# Patient Record
Sex: Female | Born: 2000 | Race: White | Hispanic: No | Smoking: Never smoker
Health system: Southern US, Community
[De-identification: ages and names within clinical notes are randomized; demographics above are authoritative.]

---

## 2018-01-07 ENCOUNTER — Ambulatory Visit (INDEPENDENT_AMBULATORY_CARE_PROVIDER_SITE_OTHER): Payer: Medicaid Other | Admitting: Allergy and Immunology

## 2018-01-07 ENCOUNTER — Encounter: Payer: Self-pay | Admitting: Allergy and Immunology

## 2018-01-07 ENCOUNTER — Encounter

## 2018-01-07 VITALS — BP 130/80 | HR 88 | Temp 99.2°F | Resp 24 | Ht 64.8 in | Wt 117.0 lb

## 2018-01-07 DIAGNOSIS — L501 Idiopathic urticaria: Secondary | ICD-10-CM

## 2018-01-07 DIAGNOSIS — L505 Cholinergic urticaria: Secondary | ICD-10-CM | POA: Diagnosis not present

## 2018-01-07 MED ORDER — RANITIDINE HCL 150 MG PO TABS: 150.0000 mg | ORAL_TABLET | Freq: Two times a day (BID) | ORAL | 5 refills | Status: DC

## 2018-01-07 MED ORDER — LORATADINE 10 MG PO TABS: 10.0000 mg | ORAL_TABLET | Freq: Two times a day (BID) | ORAL | 5 refills | Status: DC

## 2018-01-07 MED ORDER — MONTELUKAST SODIUM 10 MG PO TABS: 10.0000 mg | ORAL_TABLET | Freq: Every day | ORAL | 5 refills | Status: DC

## 2018-01-07 NOTE — Patient Instructions (Addendum)
  1.  Allergen avoidance measures?  2.  Every day utilize the following medications:   A.  Loratadine 10 mg twice a day  B.  Ranitidine 150 mg twice a day  C.  Montelukast 10 mg once a day  3.  Blood - CBC w/diff, CMP, TSH, T4, TP, alpha gal + UA  4.  Omalizumab injections?  5.  Return to clinic in 4 weeks or earlier if problem

## 2018-01-07 NOTE — Progress Notes (Signed)
Dear Dr. Jeanie Sewer,  Thank you for referring Heather Collier to the [facility] [place] on 01/07/2018.   Below is a summation of this patient's evaluation and recommendations.  Thank you for your referral. I will keep you informed about this patient's response to treatment.   If you have any questions please do not hesitate to contact me.   Sincerely,  Jessica Priest, MD Allergy / Immunology [place] [facility]   ______________________________________________________________________    NEW PATIENT NOTE  Referring Provider: Noni Saupe, MD Primary Provider: Noni Saupe, MD Date of office visit: 01/07/2018    Subjective:   Chief Complaint:  Heather Collier (DOB: 01/02/2001) is a 17 y.o. female who presents to the clinic on 01/07/2018 with a chief complaint of Urticaria .     HPI: [name] presents to this clinic in evaluation of urticaria.  Apparently while she was pregnant starting in February 2018 she developed red raised itchy lesions that developed across her body without any healing with scar or hyperpigmentation and without any associated systemic or constitutional symptoms and with a very significant heat to trigger.  If she gets hot or exercises or gets sweaty or takes a hot shower she developed diffuse urticaria.  Even without heat though she does develop problems with urticaria.  Her lesions never last greater than a day or so.  She has tried antihistamines but they have made her so sleepy she cannot function.  It sounds as though she has tried Zyrtec and Benadryl.  She is tried topical Benadryl which does not work.  There is no obvious provoking factor giving rise to this issue.  Her pregnancy went well and she delivered in June 2018.  She has not been using any supplements or new medications and she has not had any significant environmental changes that have occurred during this  timeframe.  And she has not had a significant environmental change that may account for this issue.  She does not really have an atopic history except for occasionally pollen causing some problems with watery eyes.  History reviewed. No pertinent past medical history.  History reviewed. No pertinent surgical history.  Allergies as of 01/07/2018   No Known Allergies     Medication List      ibuprofen 800 MG tablet Commonly known as:  ADVIL,MOTRIN TAKE ONE TABLET BY MOUTH EVERY 6 HOURS AS NEEDED FOR PAIN   lamoTRIgine 25 MG tablet Commonly known as:  LAMICTAL TAKE ONE TABLET BY MOUTH EVERY DAY for 14 days then TAKE 2 TABLETS BY MOUTH EVERY DAY   norelgestromin-ethinyl estradiol 150-35 MCG/24HR transdermal patch Commonly known as:  ORTHO EVRA Place onto the skin.       Review of systems negative except as noted in HPI / PMHx or noted below:  Review of Systems  Constitutional: Negative.   HENT: Negative.   Eyes: Negative.   Respiratory: Negative.   Cardiovascular: Negative.   Gastrointestinal: Negative.   Genitourinary: Negative.   Musculoskeletal: Negative.   Skin: Negative.   Neurological: Negative.   Endo/Heme/Allergies: Negative.   Psychiatric/Behavioral: Negative.     History reviewed. No pertinent family history.  Social History   Socioeconomic History  . Marital status: Single    Spouse name: Not on file  . Number of children: Not on file  . Years of education: Not on file  . Highest education level: Not on file  Occupational  History  . Not on file  Social Needs  . Financial resource strain: Not on file  . Food insecurity:    Worry: Not on file    Inability: Not on file  . Transportation needs:    Medical: Not on file    Non-medical: Not on file  Tobacco Use  . Smoking status: Never Smoker  . Smokeless tobacco: Never Used  Substance and Sexual Activity  . Alcohol use: Never    Frequency: Never  . Drug use: Never  . Sexual activity: Not on  file  Lifestyle  . Physical activity:    Days per week: Not on file    Minutes per session: Not on file  . Stress: Not on file  Relationships  . Social connections:    Talks on phone: Not on file    Gets together: Not on file    Attends religious service: Not on file    Active member of club or organization: Not on file    Attends meetings of clubs or organizations: Not on file    Relationship status: Not on file  . Intimate partner violence:    Fear of current or ex partner: Not on file    Emotionally abused: Not on file    Physically abused: Not on file    Forced sexual activity: Not on file  Other Topics Concern  . Not on file  Social History Narrative  . Not on file    Environmental and Social history  Lives in a mobile home with a dry environment, no animals located inside the household, no carpet in the bedroom, no plastic on the bed, no plastic on the pillow, and no smokers located inside the household. Objective:   Vitals:   01/07/18 1339  BP: 130/80  Pulse: 88  Resp: (!) 24  Temp: 99.2 F (37.3 C)  SpO2: 99%   Height: 5' 4.8" (164.6 cm) Weight: 117 lb (53.1 kg)  Physical Exam  HENT:  Head: Normocephalic. Head is without right periorbital erythema and without left periorbital erythema.  Right Ear: Tympanic membrane, external ear and ear canal normal.  Left Ear: Tympanic membrane, external ear and ear canal normal.  Nose: Nose normal. No mucosal edema or rhinorrhea.  Mouth/Throat: Oropharynx is clear and moist and mucous membranes are normal. No oropharyngeal exudate.  Eyes: Pupils are equal, round, and reactive to light. Conjunctivae and lids are normal.  Neck: Trachea normal. No tracheal deviation present. No thyromegaly present.  Cardiovascular: Normal rate, regular rhythm, S1 normal, S2 normal and normal heart sounds.  No murmur heard. Pulmonary/Chest: Effort normal. No stridor. No respiratory distress. She has no wheezes. She has no rales. She exhibits  no tenderness.  Abdominal: Soft. She exhibits no distension and no mass. There is no hepatosplenomegaly. There is no tenderness. There is no rebound and no guarding.  Musculoskeletal: She exhibits no edema or tenderness.  Lymphadenopathy:       Head (right side): No tonsillar adenopathy present.       Head (left side): No tonsillar adenopathy present.    She has no cervical adenopathy.    She has no axillary adenopathy.  Neurological: She is alert.  Skin: Rash (Multiple blanching urticarial lesions extremities and trunk) noted. She is not diaphoretic. No erythema. No pallor. Nails show no clubbing.    Diagnostics: Allergy skin tests were performed.  She did not demonstrate any hypersensitivity to a screening panel of aeroallergens or foods.  Assessment and Plan:  1. Idiopathic urticaria   2. Cholinergic urticaria     1.  Allergen avoidance measures?  2.  Every day utilize the following medications:   A.  Loratadine 10 mg twice a day  B.  Ranitidine 150 mg twice a day  C.  Montelukast 10 mg once a day  3.  Blood - CBC w/diff, CMP, TSH, T4, TP, alpha gal + UA  4.  Omalizumab injections?  5.  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  Some of her presentation is very reminiscent of cholinergic urticaria but certainly this does not appear to explain all of her issues.  We will try her on a collection of medications as noted above to see if we can manage her immunological hyperreactivity and we will also look for a worrisome systemic disease contributing to this issue.  She would be a candidate for omalizumab injections pending her response to the therapy noted above.  I will see her back in this clinic in 4 weeks or earlier if there is a problem.  Jessica Priest, MD Allergy / Immunology [place] [facility] [place]

## 2018-01-11 ENCOUNTER — Encounter: Payer: Self-pay | Admitting: Allergy and Immunology

## 2018-09-03 ENCOUNTER — Other Ambulatory Visit: Payer: Self-pay | Admitting: Allergy and Immunology

## 2021-03-14 ENCOUNTER — Encounter: Payer: Self-pay | Admitting: General Practice

## 2021-03-14 ENCOUNTER — Ambulatory Visit (INDEPENDENT_AMBULATORY_CARE_PROVIDER_SITE_OTHER): Payer: Commercial Managed Care - PPO | Admitting: Family Medicine

## 2021-03-14 ENCOUNTER — Other Ambulatory Visit: Payer: Self-pay

## 2021-03-14 ENCOUNTER — Other Ambulatory Visit (HOSPITAL_COMMUNITY)
Admission: RE | Admit: 2021-03-14 | Discharge: 2021-03-14 | Disposition: A | Discharge status: Home / Self Care | Payer: Commercial Managed Care - PPO | Source: Ambulatory Visit | Attending: Family Medicine | Admitting: Family Medicine

## 2021-03-14 ENCOUNTER — Encounter: Payer: Self-pay | Admitting: Family Medicine

## 2021-03-14 DIAGNOSIS — Z3481 Encounter for supervision of other normal pregnancy, first trimester: Secondary | ICD-10-CM

## 2021-03-14 DIAGNOSIS — Z3A12 12 weeks gestation of pregnancy: Secondary | ICD-10-CM

## 2021-03-14 DIAGNOSIS — Z3480 Encounter for supervision of other normal pregnancy, unspecified trimester: Secondary | ICD-10-CM | POA: Insufficient documentation

## 2021-03-14 MED ORDER — DOXYLAMINE-PYRIDOXINE 10-10 MG PO TBEC: 2.0000 | DELAYED_RELEASE_TABLET | Freq: Every day | ORAL | 5 refills | Status: DC

## 2021-03-14 NOTE — Progress Notes (Signed)
Subjective:  Heather Collier is a G2P1001 [redacted]w[redacted]d by LMP c/w [place] today being seen today for her first obstetrical visit.  Her obstetrical history is significant for  previous uncomplicated pregnancy and vaginal delivery at term . Planned pregnancy - FOB involved. Patient  uncertain about  breast feeding. Pregnancy history fully reviewed.  Patient reports nausea.  BP 129/72   Pulse 85   Wt 111 lb (50.3 kg)   LMP 12/14/2020   BMI 18.59 kg/m   HISTORY: OB History  Gravida Para Term Preterm AB Living  2 1 1     1   SAB IAB Ectopic Multiple Live Births          1    # Outcome Date GA Lbr Len/2nd Weight Sex Delivery Anes PTL Lv  2 Current           1 Term 2018 [redacted]w[redacted]d   F Vag-Spont None N LIV    No past medical history on file.  No past surgical history on file.  Family History  Problem Relation Age of Onset   Diabetes Mother      Exam  BP 129/72   Pulse 85   Wt 111 lb (50.3 kg)   LMP 12/14/2020   BMI 18.59 kg/m   Chaperone present during exam  CONSTITUTIONAL: Well-developed, well-nourished female in no acute distress.  HENT:  Normocephalic, atraumatic, External right and left ear normal. Oropharynx is clear and moist EYES: Conjunctivae and EOM are normal. Pupils are equal, round, and reactive to light. No scleral icterus.  NECK: Normal range of motion, supple, no masses.  Normal thyroid.  CARDIOVASCULAR: Normal heart rate noted, regular rhythm RESPIRATORY: Clear to auscultation bilaterally. Effort and breath sounds normal, no problems with respiration noted. BREASTS: Symmetric in size. No masses, skin changes, nipple drainage, or lymphadenopathy. ABDOMEN: Soft, normal bowel sounds, no distention noted.  No tenderness, rebound or guarding.  PELVIC: Normal appearing external genitalia; normal appearing vaginal mucosa and cervix. No abnormal discharge noted. Normal uterine size, no other palpable masses, no uterine or adnexal tenderness. MUSCULOSKELETAL: Normal range of  motion. No tenderness.  No cyanosis, clubbing, or edema.  2+ distal pulses. SKIN: Skin is warm and dry. No rash noted. Not diaphoretic. No erythema. No pallor. NEUROLOGIC: Alert and oriented to person, place, and time. Normal reflexes, muscle tone coordination. No cranial nerve deficit noted. PSYCHIATRIC: Normal mood and affect. Normal behavior. Normal judgment and thought content.    Assessment:    Pregnancy: G2P1001 Patient Active Problem List   Diagnosis Date Noted   Supervision of other normal pregnancy, antepartum 03/14/2021      Plan:   1. Supervision of other normal pregnancy, antepartum 03/16/2021 done by me shows GA [redacted]w[redacted]d. - CBC/D/Plt+RPR+Rh+ABO+RubIgG... - POC Urinalysis Dipstick OB - Culture, OB Urine - [redacted]w[redacted]d MFM OB COMP + 14 WK; Future - Genetic Screening - Cytology - PAP( [place]) - Hemoglobin A1c - CHL AMB BABYSCRIPTS OPT IN - Cervicovaginal ancillary only( [place])    Initial labs obtained Continue prenatal vitamins Reviewed n/v relief measures and warning s/s to report Reviewed recommended weight gain based on pre-gravid BMI Encouraged well-balanced diet Genetic & carrier screening discussed: requests Panorama,  Ultrasound discussed; fetal survey: requested CCNC completed> form faxed if has or is planning to apply for medicaid The nature of [place] [facility] for [place] with multiple MDs and other Advanced Practice Providers was explained to patient; also emphasized that fellows, residents, and students are part of our team. Home  bp cuff. Check bp weekly, let us know if >140/90.   Problem list reviewed and updated. 75% of 30 min visit spent on counseling and coordination of care.     Levie Heritage 03/14/2021

## 2021-03-15 ENCOUNTER — Other Ambulatory Visit: Payer: Commercial Managed Care - PPO

## 2021-03-15 DIAGNOSIS — Z833 Family history of diabetes mellitus: Secondary | ICD-10-CM

## 2021-03-15 DIAGNOSIS — Z3A13 13 weeks gestation of pregnancy: Secondary | ICD-10-CM

## 2021-03-15 DIAGNOSIS — Z1379 Encounter for other screening for genetic and chromosomal anomalies: Secondary | ICD-10-CM

## 2021-03-15 NOTE — Progress Notes (Signed)
Chart reviewed - agree with CMA/RN documentation.  ° °

## 2021-03-15 NOTE — Progress Notes (Signed)
Pt presents for New OB and Panorama labs. Pt was given lab slip and sent to the lab. Yoceline Bazar l Alayshia Marini, CMA

## 2021-03-15 NOTE — Addendum Note (Signed)
Addended by: Mikey Bussing on: 03/15/2021 08:44 AM   Modules accepted: Orders

## 2021-03-16 LAB — CBC/D/PLT+RPR+RH+ABO+RUBIGG...
Antibody Screen: NEGATIVE
Basophils Absolute: 0 10*3/uL (ref 0.0–0.2)
Basos: 0 %
EOS (ABSOLUTE): 0.1 10*3/uL (ref 0.0–0.4)
Eos: 1 %
HCV Ab: <0.1 s/co ratio (ref 0.0–0.9)
HIV Screen 4th Generation wRfx: NONREACTIVE
Hematocrit: 34.2 % (ref 34.0–46.6)
Hemoglobin: 11.1 g/dL (ref 11.1–15.9)
Hepatitis B Surface Ag: NEGATIVE
Immature Grans (Abs): 0 10*3/uL (ref 0.0–0.1)
Immature Granulocytes: 0 %
Lymphocytes Absolute: 1.4 10*3/uL (ref 0.7–3.1)
Lymphs: 23 %
MCH: 29.4 pg (ref 26.6–33.0)
MCHC: 32.5 g/dL (ref 31.5–35.7)
MCV: 91 fL (ref 79–97)
Monocytes Absolute: 0.3 10*3/uL (ref 0.1–0.9)
Monocytes: 5 %
Neutrophils Absolute: 4.4 10*3/uL (ref 1.4–7.0)
Neutrophils: 71 %
Platelets: 174 10*3/uL (ref 150–450)
RBC: 3.78 x10E6/uL (ref 3.77–5.28)
RDW: 11.8 % (ref 11.7–15.4)
RPR Ser Ql: NONREACTIVE
Rh Factor: POSITIVE
Rubella Antibodies, IGG: 6.95 index (ref >0.99)
WBC: 6.3 10*3/uL (ref 3.4–10.8)

## 2021-03-16 LAB — HEMOGLOBIN A1C
Est. average glucose Bld gHb Est-mCnc: 103 mg/dL
Hgb A1c MFr Bld: 5.2 % (ref 4.8–5.6)

## 2021-03-16 LAB — CULTURE, OB URINE

## 2021-03-16 LAB — URINE CULTURE, OB REFLEX

## 2021-03-16 LAB — HCV INTERPRETATION

## 2021-03-19 LAB — CERVICOVAGINAL ANCILLARY ONLY
Chlamydia: NEGATIVE
Comment: NEGATIVE
Comment: NEGATIVE
Comment: NORMAL
Neisseria Gonorrhea: NEGATIVE
Trichomonas: NEGATIVE

## 2021-03-21 LAB — CYTOLOGY - PAP: Diagnosis: NEGATIVE

## 2021-04-01 ENCOUNTER — Encounter: Payer: Self-pay | Admitting: General Practice

## 2021-04-11 ENCOUNTER — Other Ambulatory Visit: Payer: Self-pay

## 2021-04-11 ENCOUNTER — Ambulatory Visit (INDEPENDENT_AMBULATORY_CARE_PROVIDER_SITE_OTHER): Payer: Commercial Managed Care - PPO | Admitting: Obstetrics and Gynecology

## 2021-04-11 ENCOUNTER — Encounter: Payer: Self-pay | Admitting: Obstetrics and Gynecology

## 2021-04-11 VITALS — BP 103/74 | HR 104 | Wt 113.0 lb

## 2021-04-11 DIAGNOSIS — Z3A16 16 weeks gestation of pregnancy: Secondary | ICD-10-CM | POA: Diagnosis not present

## 2021-04-11 DIAGNOSIS — Z3482 Encounter for supervision of other normal pregnancy, second trimester: Secondary | ICD-10-CM | POA: Diagnosis not present

## 2021-04-11 DIAGNOSIS — Z3480 Encounter for supervision of other normal pregnancy, unspecified trimester: Secondary | ICD-10-CM

## 2021-04-11 NOTE — Progress Notes (Signed)
   PRENATAL VISIT NOTE  Subjective:  Heather Collier is a 20 y.o. G2P1001 at [redacted]w[redacted]d being seen today for ongoing prenatal care.  She is currently monitored for the following issues for this low-risk pregnancy and has Supervision of other normal pregnancy, antepartum on their problem list.  Patient reports no complaints.  Contractions: Not present. Vag. Bleeding: None.  Movement: Absent. Denies leaking of fluid.   The following portions of the patient's history were reviewed and updated as appropriate: allergies, current medications, past family history, past medical history, past social history, past surgical history and problem list.   Objective:   Vitals:   04/11/21 0947  BP: 103/74  Pulse: (!) 104  Weight: 113 lb (51.3 kg)    Fetal Status: Fetal Heart Rate (bpm): 146   Movement: Absent     General:  Alert, oriented and cooperative. Patient is in no acute distress.  Skin: Skin is warm and dry. No rash noted.   Cardiovascular: Normal heart rate noted  Respiratory: Normal respiratory effort, no problems with respiration noted  Abdomen: Soft, gravid, appropriate for gestational age.  Pain/Pressure: Absent     Pelvic: Cervical exam deferred        Extremities: Normal range of motion.  Edema: None  Mental Status: Normal mood and affect. Normal behavior. Normal judgment and thought content.   Assessment and Plan:  Pregnancy: G2P1001 at [redacted]w[redacted]d 1. [redacted] weeks gestation of pregnancy   2. Supervision of other normal pregnancy, antepartum Patient is doing well Patient opted to defer AFP to next visit Anatomy ultrasound scheduled  Preterm labor symptoms and general obstetric precautions including but not limited to vaginal bleeding, contractions, leaking of fluid and fetal movement were reviewed in detail with the patient. Please refer to After Visit Summary for other counseling recommendations.   Return in about 4 weeks (around 05/09/2021) for in person, ROB, Low risk.  Future  Appointments  Date Time Provider Department Center  04/26/2021 10:45 AM [facility] US5 [facility]  05/09/2021  9:15 AM Levie Heritage, DO [facility] None    Catalina Antigua, MD

## 2021-04-26 ENCOUNTER — Other Ambulatory Visit: Payer: Self-pay

## 2021-04-26 ENCOUNTER — Ambulatory Visit: Payer: Medicaid Other | Attending: Family Medicine

## 2021-04-26 DIAGNOSIS — Z3480 Encounter for supervision of other normal pregnancy, unspecified trimester: Secondary | ICD-10-CM | POA: Diagnosis not present

## 2021-04-26 IMAGING — US US MFM OB COMP +14 WKS
1 series · 12 of 28 positions shown · non-contrast
Comparison: none

[Series 1: us mfm ob comp +14 wks · 102 acquisitions, 12 frames shown]
[im 4/102]
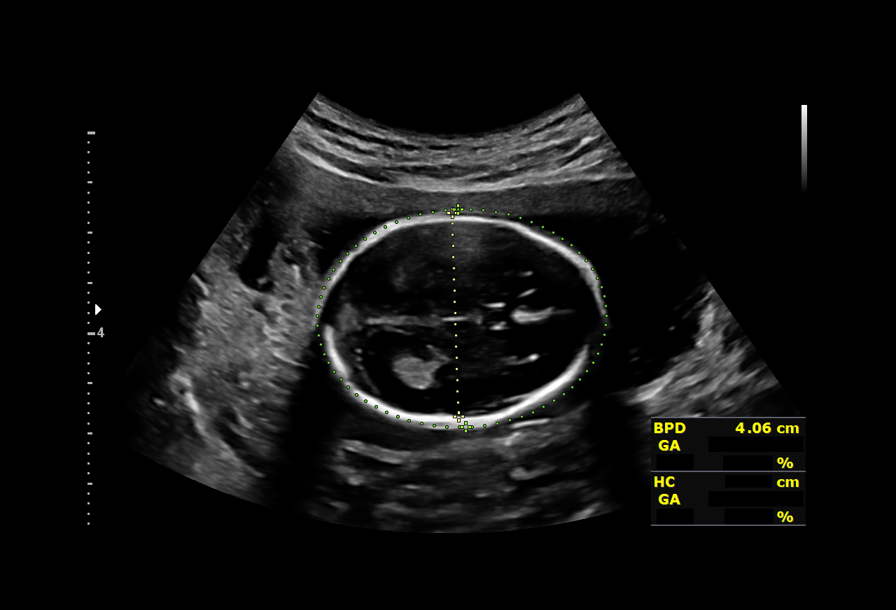
[im 12/102]
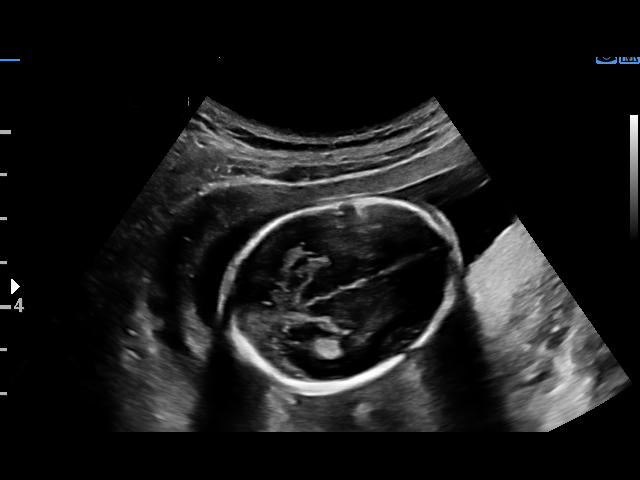
[im 19/102]
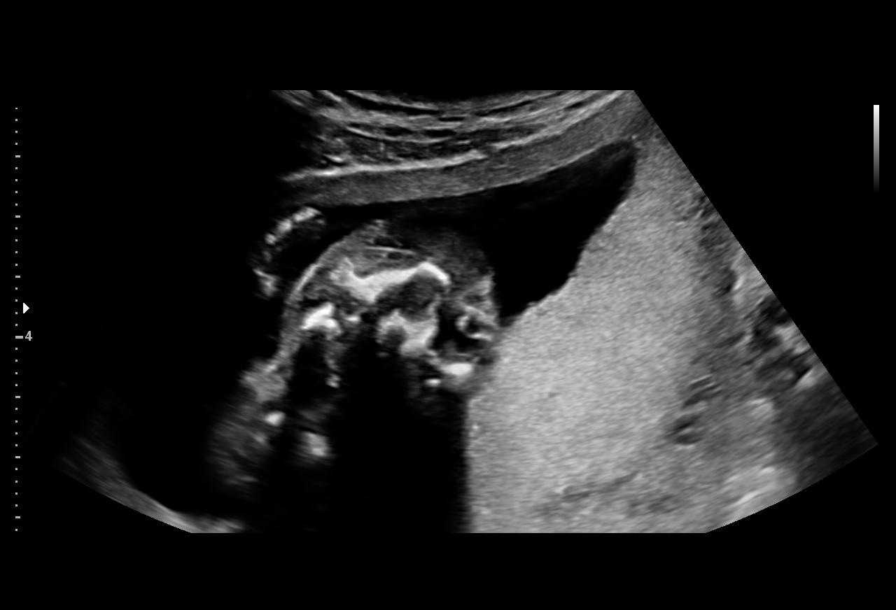
[im 30/102]
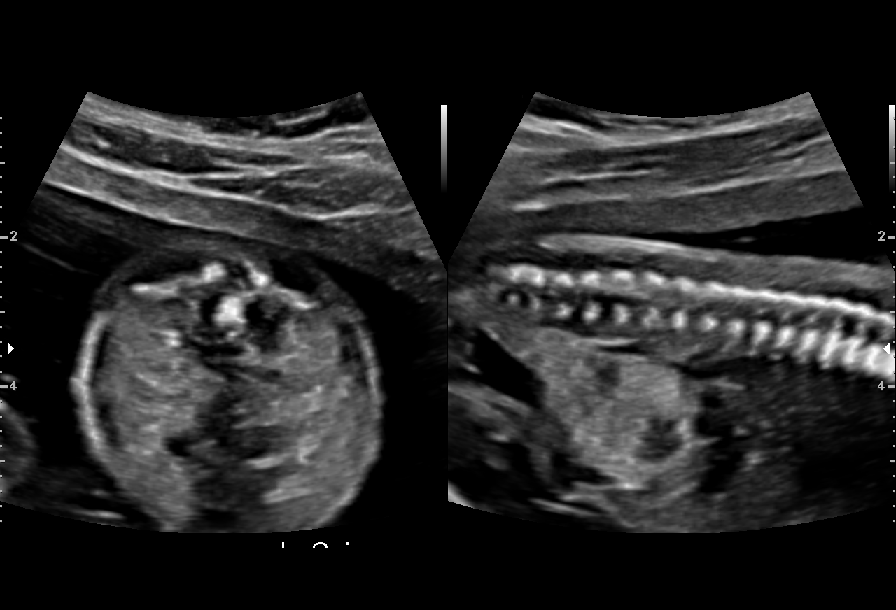
[im 38/102]
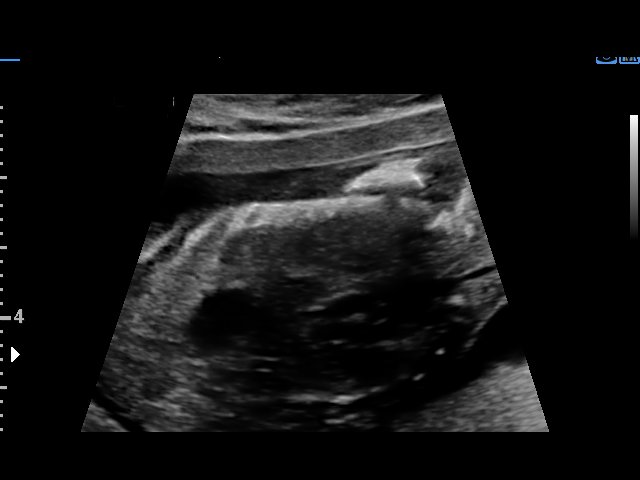
[im 45/102]
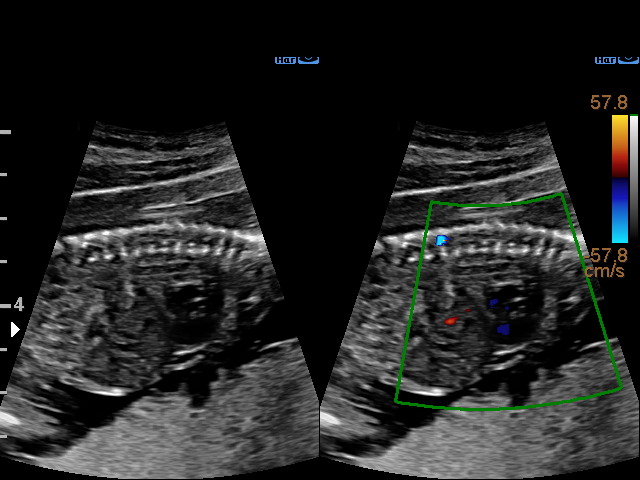
[im 57/102]
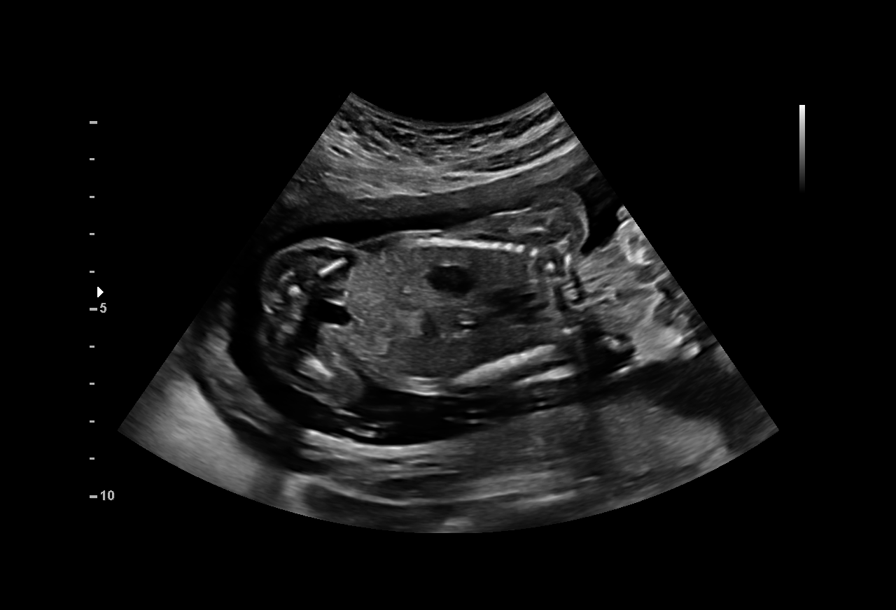
[im 64/102]
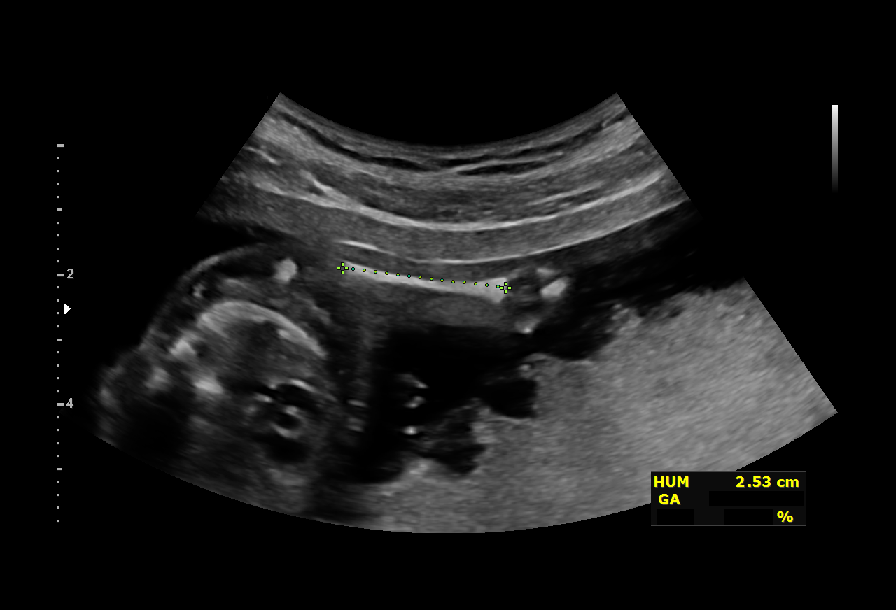
[im 72/102]
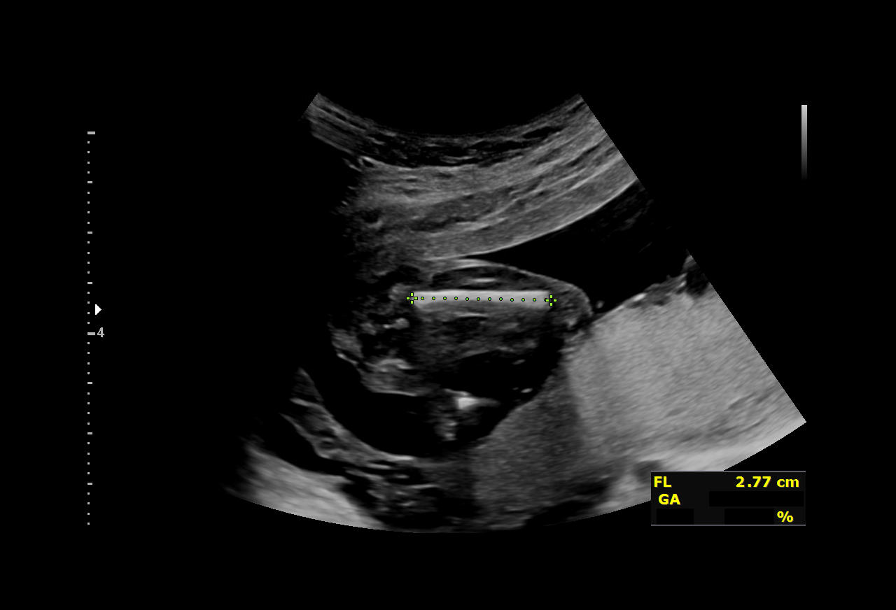
[im 83/102]
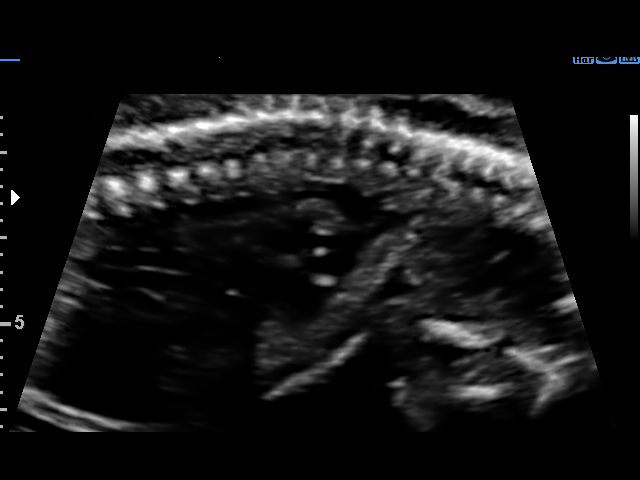
[im 90/102]
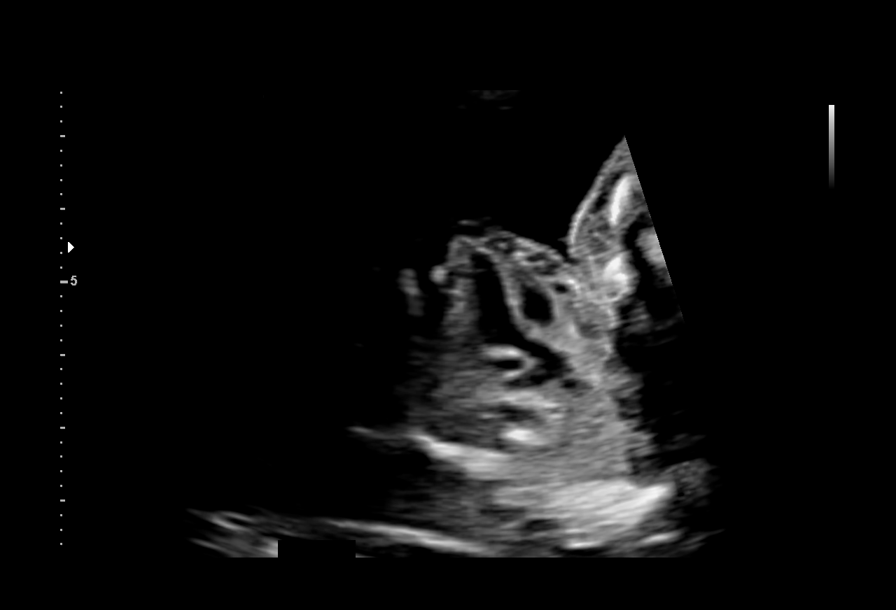
[im 98/102]
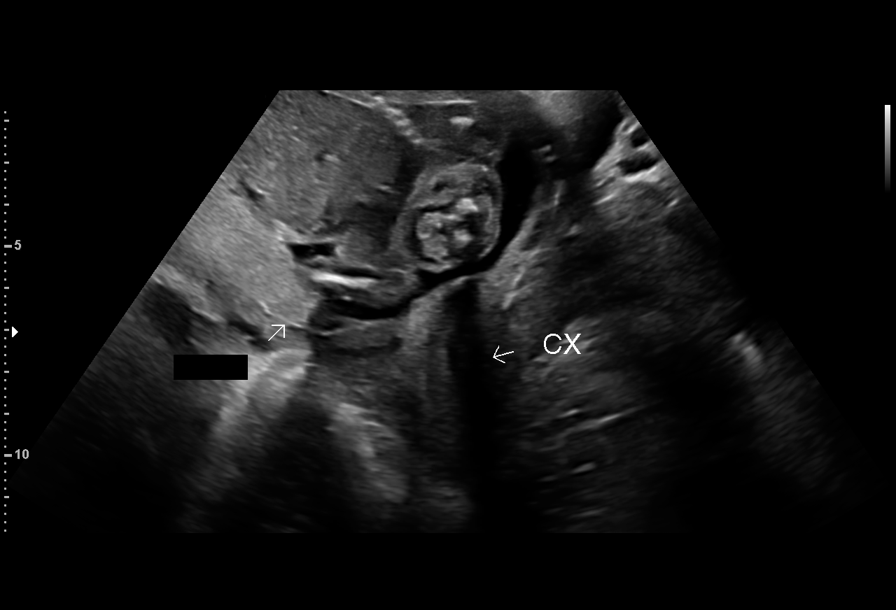

[12 of 28 positions shown; findings below may reference images not displayed]

Ref. Address:     [ZN] [address]

Indications

 19 weeks gestation of pregnancy
 Encounter for antenatal screening for           [ZN]
 malformations
 Low Risk NIPS

## 2021-05-09 ENCOUNTER — Encounter: Payer: Medicaid Other | Admitting: Family Medicine

## 2021-05-14 ENCOUNTER — Telehealth: Payer: Self-pay

## 2021-05-14 NOTE — Telephone Encounter (Signed)
Pt called wanting to know is ok to go to an OBGYN closer to her. Pt made aware that it is fine to go to an office closer to her. Understanding was voiced. Jazmene Racz l Karn Derk, CMA

## 2021-06-07 ENCOUNTER — Encounter: Payer: Commercial Managed Care - PPO | Admitting: Family Medicine

## 2023-01-14 ENCOUNTER — Ambulatory Visit: Payer: Medicaid Other | Admitting: Internal Medicine

## 2023-01-19 ENCOUNTER — Encounter: Payer: Self-pay | Admitting: Internal Medicine

## 2023-01-19 ENCOUNTER — Ambulatory Visit: Payer: Medicaid Other | Admitting: Internal Medicine

## 2023-01-19 VITALS — BP 116/70 | HR 85 | Temp 98.1°F | Resp 18 | Ht 64.0 in | Wt 110.2 lb

## 2023-01-19 DIAGNOSIS — R350 Frequency of micturition: Secondary | ICD-10-CM | POA: Diagnosis not present

## 2023-01-19 DIAGNOSIS — G44229 Chronic tension-type headache, not intractable: Secondary | ICD-10-CM | POA: Diagnosis not present

## 2023-01-19 DIAGNOSIS — J302 Other seasonal allergic rhinitis: Secondary | ICD-10-CM

## 2023-01-19 DIAGNOSIS — R519 Headache, unspecified: Secondary | ICD-10-CM

## 2023-01-19 LAB — POCT URINALYSIS DIPSTICK
Bilirubin, UA: NEGATIVE
Blood, UA: NEGATIVE
Glucose, UA: NEGATIVE
Ketones, UA: NEGATIVE
Leukocytes, UA: NEGATIVE
Nitrite, UA: NEGATIVE
Protein, UA: NEGATIVE
Spec Grav, UA: 1.02 (ref 1.010–1.025)
Urobilinogen, UA: 0.2 E.U./dL
pH, UA: 8.5 — AB (ref 5.0–8.0)

## 2023-01-19 MED ORDER — FLUTICASONE PROPIONATE 50 MCG/ACT NA SUSP: 1.0000 | Freq: Every day | NASAL | 2 refills | Status: DC

## 2023-01-19 MED ORDER — GEMTESA 75 MG PO TABS: 1.0000 | ORAL_TABLET | Freq: Every day | ORAL | 0 refills | Status: DC

## 2023-01-19 NOTE — Assessment & Plan Note (Signed)
I have given her samples of Gemtesa to see if this help. Her UA has no sign of infection

## 2023-01-19 NOTE — Assessment & Plan Note (Signed)
Probably due to sinusitis and she will use nasal spray at night

## 2023-01-19 NOTE — Progress Notes (Signed)
   Office Visit  Subjective   Patient ID: Heather Collier   DOB: [birth year]   Age: 22 y.o.   MRN: [id number]   Chief Complaint Chief Complaint  Patient presents with   Office visit    Headaches, dizziness, and indigestion.     History of Present Illness 22 years old home staying mom of two kids, is here c/o frontal headache after her periods finished but still has it for 1 week but came back again. She feel little stuffy nose, when she go out. She denies any periods problem, she also is c/o both rib pain.  She also is c/o increase urinary frequency every 30 minutes. She denies any accident. No burning urine, no fever or chills.  The only medicine she take is birth control tablet.   Past Medical History No past medical history on file.   Allergies Not on File   Review of Systems Review of Systems  Constitutional: Negative.   HENT:  Positive for congestion.   Cardiovascular:  Positive for chest pain.  Genitourinary:  Positive for frequency.       Objective:    Vitals BP 116/70 (BP Location: Left Arm, Patient Position: Sitting, Cuff Size: Normal)   Pulse 85   Temp 98.1 F (36.7 C)   Resp 18   Ht 5\' 4"  (1.626 m)   Wt 110 lb 4 oz (50 kg)   SpO2 94%   BMI 18.92 kg/m    Physical Examination Physical Exam Constitutional:      Appearance: Normal appearance.  HENT:     Head: Normocephalic and atraumatic.  Cardiovascular:     Rate and Rhythm: Normal rate and regular rhythm.     Heart sounds: Normal heart sounds.  Pulmonary:     Effort: Pulmonary effort is normal.     Breath sounds: Normal breath sounds.  Abdominal:     General: Bowel sounds are normal.     Palpations: Abdomen is soft.  Neurological:     Mental Status: She is alert.        Assessment & Plan:   Urinary frequency I have given her samples of Gemtesa to see if this help. Her UA has no sign of infection  Frontal headache Probably due to sinusitis and she will use nasal spray at  night  Seasonal allergies She will take antihistamine and nasal spray  She will call if she is not better.  No follow-ups on file.   Eloisa Northern, MD

## 2023-01-19 NOTE — Assessment & Plan Note (Signed)
She will take antihistamine and nasal spray

## 2023-01-26 ENCOUNTER — Encounter: Payer: Self-pay | Admitting: Internal Medicine

## 2023-01-26 ENCOUNTER — Ambulatory Visit: Payer: Medicaid Other | Admitting: Internal Medicine

## 2023-01-26 VITALS — BP 100/74 | HR 88 | Temp 98.3°F | Resp 18 | Ht 64.0 in | Wt 111.4 lb

## 2023-01-26 DIAGNOSIS — K581 Irritable bowel syndrome with constipation: Secondary | ICD-10-CM | POA: Insufficient documentation

## 2023-01-26 DIAGNOSIS — R140 Abdominal distension (gaseous): Secondary | ICD-10-CM | POA: Diagnosis not present

## 2023-01-26 DIAGNOSIS — K5900 Constipation, unspecified: Secondary | ICD-10-CM

## 2023-01-26 DIAGNOSIS — R519 Headache, unspecified: Secondary | ICD-10-CM

## 2023-01-26 DIAGNOSIS — R350 Frequency of micturition: Secondary | ICD-10-CM

## 2023-01-26 DIAGNOSIS — D649 Anemia, unspecified: Secondary | ICD-10-CM | POA: Insufficient documentation

## 2023-01-26 DIAGNOSIS — R42 Dizziness and giddiness: Secondary | ICD-10-CM | POA: Diagnosis not present

## 2023-01-26 DIAGNOSIS — M545 Low back pain, unspecified: Secondary | ICD-10-CM | POA: Diagnosis not present

## 2023-01-26 LAB — POCT URINALYSIS DIPSTICK
Bilirubin, UA: NEGATIVE
Blood, UA: NEGATIVE
Glucose, UA: NEGATIVE
Ketones, UA: NEGATIVE
Nitrite, UA: NEGATIVE
Protein, UA: POSITIVE — AB
Spec Grav, UA: 1.025 (ref 1.010–1.025)
Urobilinogen, UA: 0.2 E.U./dL
pH, UA: 7.5 (ref 5.0–8.0)

## 2023-01-26 LAB — POCT URINE PREGNANCY: Preg Test, Ur: NEGATIVE

## 2023-01-26 MED ORDER — NAPROXEN SODIUM ER 500 MG PO TB24: 500.0000 mg | ORAL_TABLET | ORAL | 1 refills | Status: DC | PRN

## 2023-01-26 MED ORDER — SOLIFENACIN SUCCINATE 5 MG PO TABS: 5.0000 mg | ORAL_TABLET | Freq: Every day | ORAL | 5 refills | Status: DC

## 2023-01-26 NOTE — Assessment & Plan Note (Signed)
No infection from UA and will monitor and may try vesicare.

## 2023-01-26 NOTE — Assessment & Plan Note (Signed)
She may have migrain and will send her NSAID and then re-evaluate.

## 2023-01-26 NOTE — Assessment & Plan Note (Signed)
She will use back exercises and take NSAID

## 2023-01-26 NOTE — Assessment & Plan Note (Signed)
She will drink more water and take colace that she has at home.

## 2023-01-26 NOTE — Addendum Note (Signed)
Addended byEloisa Northern on: 01/26/2023 09:23 PM   Modules accepted: Orders

## 2023-01-26 NOTE — Assessment & Plan Note (Signed)
I will evaluate for for celiac disease

## 2023-01-26 NOTE — Progress Notes (Addendum)
Acute Office Visit  Subjective:     Patient ID: Heather Collier, female    DOB: 11/23/2000, 22 y.o.   MRN: [id number]  Chief Complaint  Patient presents with   Office visit     Back pain , headaches, dizziness, Shortness of breath.    HPI Patient is in today for abdomen bloating and constipation and not feel well. She says that she was evaluated for celiac disease but work up not known, she tried to cut back her dairy product but still has the same symptoms, she is not gaining weight.  She also woke up with frontal headache, she is using fluticasone nasal spray for frontal headache.  She also is c/o bilateral low back pain since her pregnancy. Pain is non radiating and hurt her most of the time.  She also is c/o increase urination and myrbetric sample did not help. Her UA is negative for UTI.  She also has history of anemia and will do CBC.  Review of Systems  Constitutional: Negative.   HENT: Negative.    Respiratory: Negative.    Cardiovascular: Negative.   Gastrointestinal:  Positive for abdominal pain, constipation and nausea.  Genitourinary:  Positive for frequency.  Neurological:  Positive for headaches.        Objective:    BP 100/74 (BP Location: Left Arm, Patient Position: Sitting, Cuff Size: Normal)   Pulse 88   Temp 98.3 F (36.8 C)   Resp 18   Ht 5\' 4"  (1.626 m)   Wt 111 lb 6 oz (50.5 kg)   SpO2 90%   BMI 19.12 kg/m    Physical Exam Constitutional:      Appearance: Normal appearance.  HENT:     Head: Normocephalic and atraumatic.  Eyes:     Extraocular Movements: Extraocular movements intact.     Pupils: Pupils are equal, round, and reactive to light.  Cardiovascular:     Rate and Rhythm: Normal rate and regular rhythm.     Heart sounds: Normal heart sounds.  Pulmonary:     Effort: Pulmonary effort is normal.     Breath sounds: Normal breath sounds.  Abdominal:     General: Bowel sounds are normal.     Palpations: Abdomen is soft.   Neurological:     General: No focal deficit present.     Mental Status: She is alert and oriented to person, place, and time.    Results for orders placed or performed in visit on 01/26/23  POCT urine pregnancy  Result Value Ref Range   Preg Test, Ur Negative Negative  POCT urinalysis dipstick  Result Value Ref Range   Color, UA yellow    Clarity, UA clear    Glucose, UA Negative Negative   Bilirubin, UA neg    Ketones, UA neg    Spec Grav, UA 1.025 1.010 - 1.025   Blood, UA neg    pH, UA 7.5 5.0 - 8.0   Protein, UA Positive (A) Negative   Urobilinogen, UA 0.2 0.2 or 1.0 E.U./dL   Nitrite, UA neg    Leukocytes, UA Trace (A) Negative   Appearance clear    Odor none         Assessment & Plan:   Problem List Items Addressed This Visit       Other   Urinary frequency    No infection from UA and will monitor and may try vesicare.       Frontal headache    She may  have migrain and will send her NSAID and then re-evaluate.       Anemia   Relevant Orders   CBC with Differential/Platelet   CMP14 + Anion Gap   Low back pain without sciatica - Primary    She will use back exercises and take NSAID      Relevant Orders   POCT urinalysis dipstick (Completed)   Constipation    She will drink more water and take colace that she has at home.       Generalized bloating    I will evaluate for for celiac disease      Relevant Orders   Tissue Transglutaminase Abs,IgG,IgA   RESOLVED: Dizziness   Relevant Orders   POCT urine pregnancy (Completed)    No orders of the defined types were placed in this encounter.   Return in about 1 month (around 02/26/2023).  Eloisa Northern, MD

## 2023-01-26 NOTE — Addendum Note (Signed)
Addended byEloisa Northern on: 01/26/2023 01:17 PM   Modules accepted: Orders, Level of Service

## 2023-01-28 LAB — CMP14 + ANION GAP
ALT: 10 IU/L (ref 0–32)
AST: 17 IU/L (ref 0–40)
Albumin/Globulin Ratio: 2 (ref 1.2–2.2)
Albumin: 4.7 g/dL (ref 4.0–5.0)
Alkaline Phosphatase: 44 IU/L (ref 44–121)
Anion Gap: 15 mmol/L (ref 10.0–18.0)
BUN/Creatinine Ratio: 10 (ref 9–23)
BUN: 8 mg/dL (ref 6–20)
Bilirubin Total: 0.5 mg/dL (ref 0.0–1.2)
CO2: 23 mmol/L (ref 20–29)
Calcium: 9.5 mg/dL (ref 8.7–10.2)
Chloride: 106 mmol/L (ref 96–106)
Creatinine, Ser: 0.82 mg/dL (ref 0.57–1.00)
Globulin, Total: 2.3 g/dL (ref 1.5–4.5)
Glucose: 93 mg/dL (ref 70–99)
Potassium: 4.4 mmol/L (ref 3.5–5.2)
Sodium: 144 mmol/L (ref 134–144)
Total Protein: 7 g/dL (ref 6.0–8.5)
eGFR: 103 mL/min/{1.73_m2} (ref >59)

## 2023-01-28 LAB — CBC WITH DIFFERENTIAL/PLATELET
Basophils Absolute: 0 10*3/uL (ref 0.0–0.2)
Basos: 1 %
EOS (ABSOLUTE): 0.1 10*3/uL (ref 0.0–0.4)
Eos: 2 %
Hematocrit: 41.4 % (ref 34.0–46.6)
Hemoglobin: 13.4 g/dL (ref 11.1–15.9)
Immature Grans (Abs): 0 10*3/uL (ref 0.0–0.1)
Immature Granulocytes: 0 %
Lymphocytes Absolute: 2.2 10*3/uL (ref 0.7–3.1)
Lymphs: 33 %
MCH: 28.7 pg (ref 26.6–33.0)
MCHC: 32.4 g/dL (ref 31.5–35.7)
MCV: 89 fL (ref 79–97)
Monocytes Absolute: 0.3 10*3/uL (ref 0.1–0.9)
Monocytes: 4 %
Neutrophils Absolute: 4 10*3/uL (ref 1.4–7.0)
Neutrophils: 60 %
Platelets: 205 10*3/uL (ref 150–450)
RBC: 4.67 x10E6/uL (ref 3.77–5.28)
RDW: 13.5 % (ref 11.7–15.4)
WBC: 6.6 10*3/uL (ref 3.4–10.8)

## 2023-01-28 LAB — TISSUE TRANSGLUTAMINASE ABS,IGG,IGA
Tissue Transglut Ab: 4 U/mL (ref 0–5)
Transglutaminase IgA: <2 U/mL (ref 0–3)

## 2023-01-29 ENCOUNTER — Telehealth: Payer: Self-pay | Admitting: Internal Medicine

## 2023-01-29 NOTE — Telephone Encounter (Signed)
I have discussed with her that her labs are normal

## 2023-08-26 ENCOUNTER — Ambulatory Visit: Payer: Medicaid Other | Admitting: Student

## 2023-08-31 ENCOUNTER — Encounter: Payer: Self-pay | Admitting: Student

## 2023-08-31 ENCOUNTER — Ambulatory Visit: Payer: Medicaid Other | Admitting: Student

## 2023-08-31 VITALS — BP 100/60 | HR 69 | Temp 98.0°F | Resp 16 | Ht 64.0 in | Wt 112.5 lb

## 2023-08-31 DIAGNOSIS — R42 Dizziness and giddiness: Secondary | ICD-10-CM | POA: Diagnosis not present

## 2023-08-31 DIAGNOSIS — Z131 Encounter for screening for diabetes mellitus: Secondary | ICD-10-CM | POA: Diagnosis not present

## 2023-08-31 DIAGNOSIS — K581 Irritable bowel syndrome with constipation: Secondary | ICD-10-CM | POA: Diagnosis not present

## 2023-08-31 DIAGNOSIS — R1030 Lower abdominal pain, unspecified: Secondary | ICD-10-CM | POA: Insufficient documentation

## 2023-08-31 NOTE — Assessment & Plan Note (Signed)
It is possible of the dizziness is related to dehydration.  We will check a BMP.   The patient should maintain adequate hydration, this will also help decrease constipation (see note above).

## 2023-08-31 NOTE — Progress Notes (Signed)
Patient was checking out. I was printing her AVS, turned around to grab it off of the printer, and I heard a loud thud on the floor.

## 2023-08-31 NOTE — Progress Notes (Signed)
Acute Office Visit  Subjective:     Patient ID: Heather Collier, female    DOB: August 20, 2001, 22 y.o.   MRN: [id number]  Chief Complaint  Patient presents with   Dizzy spells x 3 mos. hx. IBS, lumps in abdomen that come&go    Does a lot of lifting at work that causes soreness in abdomen. Also c/o excessive gas.    HPI Patient is in today for  Subjective:    Heather Collier is a 22 y.o. female who I am asked to see in consultation for evaluation of dizziness.  The dizziness has been present for 3 months. The patient describes the symptoms as lightheadedness and near syncope. Symptoms are exacerbated by rapid head movements, bending, and after eating.  The patient also complains of tinnitus. Patient denies otalgia, otorrhea, no fevers . She endorses not drinking adequate fluids during the day.    The patient reports feeling bulges in her lower abdomen that she noticed around the same time that dizziness occurred. She has a history of constipation, her last bowel movement was 2 days ago where she reports excessive whitish grey mucus on her stools. She reports soreness after heaving lifting at work. Aggravating factors: most foods, laying down a certain way, sour items. Associated symptoms: flatus, belching, and nausea. She had a pelvic exam and transvaginal ultrasound in June that negative no cysts or masses.  The patient has a history of IBS with constipation. She was seen by Dr. Charm Barges with [facility] back in 05/2023. Upon chart review the patients received diagnosis.  She was instructed to use MiraLAX once daily for 30 days, Benefiber sugar-free dextran powder 1 tablespoon daily for 30 days and a follow-up in 1 month as needed.  The patient reports that she has not complied with the regimen.  The patient requests being checked for diabetes due to family history    Review of Systems  Constitutional: Negative.   HENT: Negative.    Respiratory: Negative.     Cardiovascular: Negative.  Negative for chest pain, palpitations and leg swelling.  Gastrointestinal:  Positive for constipation. Negative for abdominal pain, blood in stool, melena, nausea and vomiting.  Genitourinary: Negative.   Musculoskeletal: Negative.   Neurological:  Positive for dizziness. Negative for tingling, tremors, focal weakness, loss of consciousness and headaches.  Psychiatric/Behavioral: Negative.          Objective:    BP 100/60 (BP Location: Left Arm, Patient Position: Sitting)   Pulse 69   Temp 98 F (36.7 C) (Temporal)   Resp 16   Ht 5\' 4"  (1.626 m)   Wt 112 lb 8 oz (51 kg)   LMP 08/14/2023 (Exact Date)   SpO2 99%   BMI 19.31 kg/m    Physical Exam Vitals reviewed.  Constitutional:      Appearance: Normal appearance.  Cardiovascular:     Rate and Rhythm: Normal rate and regular rhythm.     Pulses: Normal pulses.     Heart sounds: Normal heart sounds. No murmur heard. Pulmonary:     Effort: Pulmonary effort is normal.     Breath sounds: Normal breath sounds.  Abdominal:     General: Abdomen is flat. Bowel sounds are increased. There is no distension.     Palpations: Abdomen is soft. There is shifting dullness. There is no hepatomegaly, mass or pulsatile mass.     Tenderness: There is no abdominal tenderness.  Skin:    General: Skin is warm and dry.  Capillary Refill: Capillary refill takes less than 2 seconds.  Neurological:     General: No focal deficit present.     Mental Status: She is alert and oriented to person, place, and time.  Psychiatric:        Mood and Affect: Mood normal.        Behavior: Behavior normal.     No results found for any visits on 08/31/23.      Assessment & Plan:   Problem List Items Addressed This Visit     Dizziness - Primary   It is possible of the dizziness is related to dehydration.  We will check a BMP.   The patient should maintain adequate hydration, this will also help decrease constipation  (see note above).       Relevant Orders   Basic metabolic panel   Irritable bowel syndrome with constipation   It is possible that the bulges experienced in the lower abdomen or due to stool burden.  Upon palpation no bulging noted.  Instructions given to resume MiraLAX and Benefiber as directed by gastroenterology.  Patient reports already having medications at home.  The patient is requesting a new referral to another gastroenterologist, order placed.       Relevant Medications   polyethylene glycol powder (GLYCOLAX/MIRALAX) 17 GM/SCOOP powder   Other Relevant Orders   Ambulatory referral to Gastroenterology   Screening for diabetes mellitus   Hemoglobin A1c collected.      Relevant Orders   Hemoglobin A1c    No orders of the defined types were placed in this encounter.   Return if symptoms worsen or fail to improve.  Edwena Blow, NP

## 2023-08-31 NOTE — Assessment & Plan Note (Signed)
Hemoglobin A1c collected.

## 2023-08-31 NOTE — Assessment & Plan Note (Addendum)
It is possible that the bulges experienced in the lower abdomen or due to stool burden.  Upon palpation no bulging noted.  Instructions given to resume MiraLAX and Benefiber as directed by gastroenterology.  Patient reports already having medications at home.  The patient is requesting a new referral to another gastroenterologist, order placed.

## 2023-08-31 NOTE — Progress Notes (Signed)
The patient presented to the laboratory area for blood work.  She expresses concerns of fear of blood draws and the need for a piece of candy.  Christianne Dolin medical assistant provided the patient hard candy and proceeded for blood draw.  Moments later the patient went to check out and experience an semi-unwitnessed syncopal episode.  According to her position patient hit her head on the water fountain.  I immediately provided an assessment and maintained neck in neutral position until the patient came around.  She was pale, speaking short sentences, and unaware of what happened.  911 was called for assistance.  The patient was transferred to wheelchair, vital signs 122/56, pulse 63 and regular, resp 16, pulse ox 99% room air.  The patient complained of pain to the back of her head, neck, left shoulder and buttocks. Skin remained intact, with no lacerations. Mild redness to the scalp noted. Patient moving all extremities appropriately, she was able to contact her mother and report occurrence. Patient agreed to be transported for assessment at the [facility].

## 2023-08-31 NOTE — Patient Instructions (Addendum)
Please resume Miralax and bene-fiber per instructions for Gastroenterology.  I have sent a new referral.   Stay hydrated by drinking enough water daily.

## 2023-09-01 LAB — BASIC METABOLIC PANEL
BUN/Creatinine Ratio: 17 (ref 9–23)
BUN: 14 mg/dL (ref 6–20)
CO2: 24 mmol/L (ref 20–29)
Calcium: 9.6 mg/dL (ref 8.7–10.2)
Chloride: 105 mmol/L (ref 96–106)
Creatinine, Ser: 0.81 mg/dL (ref 0.57–1.00)
Glucose: 90 mg/dL (ref 70–99)
Potassium: 3.7 mmol/L (ref 3.5–5.2)
Sodium: 146 mmol/L — ABNORMAL HIGH (ref 134–144)
eGFR: 104 mL/min/{1.73_m2} (ref >59)

## 2023-09-01 LAB — HEMOGLOBIN A1C
Est. average glucose Bld gHb Est-mCnc: 100 mg/dL
Hgb A1c MFr Bld: 5.1 % (ref 4.8–5.6)

## 2023-09-01 NOTE — Progress Notes (Signed)
Ivar Bury, NP  Christianne Dolin, CMA I hope this message finds you well after the events in the office yesterday. I hope that you are feeling better. I have reviewed your lab results and your A1c is within normal range, so no diabetes. Your electrolytes are also within normal limits. It is VERY important to stay adequately hydrated and to eat balanced meals. Please resume the bowel regimen to assist with IBS. If dizziness continues we should re-evaluate.  Pt. Informed.  She is doing much better today other than her head and buttock hurting a little. York Spaniel thanks so much for everything.

## 2023-09-01 NOTE — Progress Notes (Signed)
I hope this message finds you well after the events in the office yesterday. I hope that you are feeling better.  I have reviewed your lab results and your A1c is within normal range, so no diabetes. Your electrolytes are also within normal limits.  It is VERY important to stay adequately hydrated and to eat balanced meals. Please resume the bowel regimen to assist with IBS. If dizziness continues we should re-evaluate.
